# Patient Record
Sex: Female | Born: 1985 | Race: Asian | Hispanic: No | Marital: Married | Smoking: Never smoker
Health system: Southern US, Community
[De-identification: ages and names within clinical notes are randomized; demographics above are authoritative.]

## PROBLEM LIST (undated history)

## (undated) DIAGNOSIS — Z789 Other specified health status: Secondary | ICD-10-CM

---

## 2018-02-12 DIAGNOSIS — Z3201 Encounter for pregnancy test, result positive: Secondary | ICD-10-CM | POA: Diagnosis not present

## 2018-02-12 DIAGNOSIS — O208 Other hemorrhage in early pregnancy: Secondary | ICD-10-CM | POA: Diagnosis not present

## 2018-02-12 DIAGNOSIS — Z3A01 Less than 8 weeks gestation of pregnancy: Secondary | ICD-10-CM | POA: Diagnosis not present

## 2018-02-14 DIAGNOSIS — Z3A01 Less than 8 weeks gestation of pregnancy: Secondary | ICD-10-CM | POA: Diagnosis not present

## 2018-02-14 DIAGNOSIS — O208 Other hemorrhage in early pregnancy: Secondary | ICD-10-CM | POA: Diagnosis not present

## 2018-03-12 NOTE — L&D Delivery Note (Signed)
Delivery Note At 3:25 PM a viable and healthy female was delivered via Vaginal, Spontaneous (Presentation: OA ).  APGAR: 7, 8; weight 2 lb 6.5 oz (1090 g).   Placenta status: spontaneous, complete.  Cord:  with the following complications: loose body cord  .  Cord pH: sent  Anesthesia:  Epidural  Episiotomy: None Lacerations: 2nd degree perineal and right labial Suture Repair: 3.0 vicryl rapide Est. Blood Loss (mL): 100  Mom to postpartum.  Baby to NICU.  [name] 07/15/2018, 4:13 PM

## 2018-03-19 DIAGNOSIS — Z3689 Encounter for other specified antenatal screening: Secondary | ICD-10-CM | POA: Diagnosis not present

## 2018-03-19 DIAGNOSIS — Z1151 Encounter for screening for human papillomavirus (HPV): Secondary | ICD-10-CM | POA: Diagnosis not present

## 2018-03-19 DIAGNOSIS — Z3401 Encounter for supervision of normal first pregnancy, first trimester: Secondary | ICD-10-CM | POA: Diagnosis not present

## 2018-03-19 DIAGNOSIS — Z124 Encounter for screening for malignant neoplasm of cervix: Secondary | ICD-10-CM | POA: Diagnosis not present

## 2018-03-19 DIAGNOSIS — Z118 Encounter for screening for other infectious and parasitic diseases: Secondary | ICD-10-CM | POA: Diagnosis not present

## 2018-03-19 DIAGNOSIS — Z3682 Encounter for antenatal screening for nuchal translucency: Secondary | ICD-10-CM | POA: Diagnosis not present

## 2018-04-01 DIAGNOSIS — Z3401 Encounter for supervision of normal first pregnancy, first trimester: Secondary | ICD-10-CM | POA: Diagnosis not present

## 2018-04-01 DIAGNOSIS — Z3682 Encounter for antenatal screening for nuchal translucency: Secondary | ICD-10-CM | POA: Diagnosis not present

## 2018-04-28 DIAGNOSIS — Z361 Encounter for antenatal screening for raised alphafetoprotein level: Secondary | ICD-10-CM | POA: Diagnosis not present

## 2018-04-28 DIAGNOSIS — Z3402 Encounter for supervision of normal first pregnancy, second trimester: Secondary | ICD-10-CM | POA: Diagnosis not present

## 2018-05-19 DIAGNOSIS — Z3A19 19 weeks gestation of pregnancy: Secondary | ICD-10-CM | POA: Diagnosis not present

## 2018-05-19 DIAGNOSIS — O358XX0 Maternal care for other (suspected) fetal abnormality and damage, not applicable or unspecified: Secondary | ICD-10-CM | POA: Diagnosis not present

## 2018-06-16 DIAGNOSIS — O358XX0 Maternal care for other (suspected) fetal abnormality and damage, not applicable or unspecified: Secondary | ICD-10-CM | POA: Diagnosis not present

## 2018-06-16 DIAGNOSIS — Z3A23 23 weeks gestation of pregnancy: Secondary | ICD-10-CM | POA: Diagnosis not present

## 2018-07-07 DIAGNOSIS — O43122 Velamentous insertion of umbilical cord, second trimester: Secondary | ICD-10-CM | POA: Diagnosis not present

## 2018-07-07 DIAGNOSIS — Z3A26 26 weeks gestation of pregnancy: Secondary | ICD-10-CM | POA: Diagnosis not present

## 2018-07-14 DIAGNOSIS — O26899 Other specified pregnancy related conditions, unspecified trimester: Secondary | ICD-10-CM | POA: Diagnosis not present

## 2018-07-14 DIAGNOSIS — O358XX0 Maternal care for other (suspected) fetal abnormality and damage, not applicable or unspecified: Secondary | ICD-10-CM | POA: Diagnosis not present

## 2018-07-14 DIAGNOSIS — Z3A27 27 weeks gestation of pregnancy: Secondary | ICD-10-CM | POA: Diagnosis not present

## 2018-07-14 DIAGNOSIS — O26892 Other specified pregnancy related conditions, second trimester: Secondary | ICD-10-CM | POA: Diagnosis not present

## 2018-07-14 DIAGNOSIS — O43122 Velamentous insertion of umbilical cord, second trimester: Secondary | ICD-10-CM | POA: Diagnosis not present

## 2018-07-15 ENCOUNTER — Inpatient Hospital Stay (HOSPITAL_COMMUNITY)
Admission: AD | Admit: 2018-07-15 | Discharge: 2018-07-17 | DRG: 805 | Disposition: A | Discharge status: Home / Self Care | Payer: BLUE CROSS/BLUE SHIELD | Attending: Obstetrics & Gynecology | Admitting: Obstetrics & Gynecology

## 2018-07-15 ENCOUNTER — Other Ambulatory Visit: Payer: Self-pay

## 2018-07-15 ENCOUNTER — Inpatient Hospital Stay (HOSPITAL_COMMUNITY): Payer: BLUE CROSS/BLUE SHIELD | Admitting: Anesthesiology

## 2018-07-15 ENCOUNTER — Encounter (HOSPITAL_COMMUNITY): Payer: Self-pay

## 2018-07-15 ENCOUNTER — Inpatient Hospital Stay (HOSPITAL_COMMUNITY)
Admission: AD | Admit: 2018-07-15 | Discharge: 2018-07-15 | Disposition: A | Discharge status: Still In Hospital | Payer: BLUE CROSS/BLUE SHIELD | Attending: Obstetrics & Gynecology | Admitting: Obstetrics & Gynecology

## 2018-07-15 DIAGNOSIS — Z3A27 27 weeks gestation of pregnancy: Secondary | ICD-10-CM | POA: Diagnosis not present

## 2018-07-15 DIAGNOSIS — D509 Iron deficiency anemia, unspecified: Secondary | ICD-10-CM | POA: Diagnosis not present

## 2018-07-15 DIAGNOSIS — O43122 Velamentous insertion of umbilical cord, second trimester: Secondary | ICD-10-CM | POA: Diagnosis present

## 2018-07-15 DIAGNOSIS — O9902 Anemia complicating childbirth: Secondary | ICD-10-CM | POA: Diagnosis not present

## 2018-07-15 DIAGNOSIS — O4703 False labor before 37 completed weeks of gestation, third trimester: Secondary | ICD-10-CM | POA: Diagnosis present

## 2018-07-15 HISTORY — DX: Other specified health status: Z78.9

## 2018-07-15 LAB — ABO/RH: ABO/RH(D): AB POS

## 2018-07-15 LAB — CBC
HCT: 32.4 % — ABNORMAL LOW (ref 36.0–46.0)
HCT: 28.5 % — ABNORMAL LOW (ref 36.0–46.0)
Hemoglobin: 10.8 g/dL — ABNORMAL LOW (ref 12.0–15.0)
Hemoglobin: 9.9 g/dL — ABNORMAL LOW (ref 12.0–15.0)
MCH: 31.1 pg (ref 26.0–34.0)
MCH: 31.6 pg (ref 26.0–34.0)
MCHC: 33.3 g/dL (ref 30.0–36.0)
MCHC: 34.7 g/dL (ref 30.0–36.0)
MCV: 93.4 fL (ref 80.0–100.0)
MCV: 91.1 fL (ref 80.0–100.0)
Platelets: 296 10*3/uL (ref 150–400)
Platelets: 293 10*3/uL (ref 150–400)
RBC: 3.47 MIL/uL — ABNORMAL LOW (ref 3.87–5.11)
RBC: 3.13 MIL/uL — ABNORMAL LOW (ref 3.87–5.11)
RDW: 13.2 % (ref 11.5–15.5)
RDW: 13.1 % (ref 11.5–15.5)
WBC: 21.1 10*3/uL — ABNORMAL HIGH (ref 4.0–10.5)
WBC: 20.1 10*3/uL — ABNORMAL HIGH (ref 4.0–10.5)
nRBC: 0 % (ref 0.0–0.2)
nRBC: 0 % (ref 0.0–0.2)

## 2018-07-15 LAB — TYPE AND SCREEN
ABO/RH(D): AB POS
Antibody Screen: NEGATIVE

## 2018-07-15 MED ORDER — INDOMETHACIN 25 MG PO CAPS
50.0000 mg | ORAL_CAPSULE | Freq: Once | ORAL | Status: AC
  Administered 2018-07-15: 50 mg via ORAL
  Filled 2018-07-15: qty 2

## 2018-07-15 MED ORDER — SIMETHICONE 80 MG PO CHEW: 80.0000 mg | CHEWABLE_TABLET | ORAL | Status: DC | PRN

## 2018-07-15 MED ORDER — LACTATED RINGERS IV SOLN: 500.0000 mL | INTRAVENOUS | Status: DC | PRN

## 2018-07-15 MED ORDER — ONDANSETRON HCL 4 MG/2ML IJ SOLN: 4.0000 mg | INTRAMUSCULAR | Status: DC | PRN

## 2018-07-15 MED ORDER — DIBUCAINE (PERIANAL) 1 % EX OINT: 1.0000 "application " | TOPICAL_OINTMENT | CUTANEOUS | Status: DC | PRN

## 2018-07-15 MED ORDER — ONDANSETRON HCL 4 MG/2ML IJ SOLN: 4.0000 mg | Freq: Four times a day (QID) | INTRAMUSCULAR | Status: DC | PRN

## 2018-07-15 MED ORDER — MAGNESIUM SULFATE 40 G IN LACTATED RINGERS - SIMPLE: 2.0000 g/h | INTRAVENOUS | Status: DC

## 2018-07-15 MED ORDER — OXYCODONE-ACETAMINOPHEN 5-325 MG PO TABS: 2.0000 | ORAL_TABLET | ORAL | Status: DC | PRN

## 2018-07-15 MED ORDER — OXYTOCIN 40 UNITS IN NORMAL SALINE INFUSION - SIMPLE MED
1.0000 m[IU]/min | INTRAVENOUS | Status: DC
  Administered 2018-07-15: 15:00:00 2 m[IU]/min via INTRAVENOUS

## 2018-07-15 MED ORDER — CALCIUM CARBONATE ANTACID 500 MG PO CHEW: 2.0000 | CHEWABLE_TABLET | ORAL | Status: DC | PRN

## 2018-07-15 MED ORDER — OXYTOCIN BOLUS FROM INFUSION
500.0000 mL | Freq: Once | INTRAVENOUS | Status: AC
  Administered 2018-07-15: 500 mL via INTRAVENOUS

## 2018-07-15 MED ORDER — MAGNESIUM SULFATE BOLUS VIA INFUSION
4.0000 g | Freq: Once | INTRAVENOUS | Status: DC
  Filled 2018-07-15: qty 500

## 2018-07-15 MED ORDER — PHENYLEPHRINE 40 MCG/ML (10ML) SYRINGE FOR IV PUSH (FOR BLOOD PRESSURE SUPPORT)
80.0000 ug | PREFILLED_SYRINGE | INTRAVENOUS | Status: DC | PRN
  Filled 2018-07-15 (×2): qty 10

## 2018-07-15 MED ORDER — ACETAMINOPHEN 325 MG PO TABS
650.0000 mg | ORAL_TABLET | ORAL | Status: DC | PRN
  Administered 2018-07-15: 11:00:00 650 mg via ORAL
  Filled 2018-07-15: qty 2

## 2018-07-15 MED ORDER — EPHEDRINE 5 MG/ML INJ
10.0000 mg | INTRAVENOUS | Status: DC | PRN
  Filled 2018-07-15: qty 2

## 2018-07-15 MED ORDER — IBUPROFEN 600 MG PO TABS
600.0000 mg | ORAL_TABLET | Freq: Four times a day (QID) | ORAL | Status: DC
  Administered 2018-07-15 – 2018-07-17 (×7): 600 mg via ORAL
  Filled 2018-07-15 (×7): qty 1

## 2018-07-15 MED ORDER — SOD CITRATE-CITRIC ACID 500-334 MG/5ML PO SOLN: 30.0000 mL | ORAL | Status: DC | PRN

## 2018-07-15 MED ORDER — LIDOCAINE HCL (PF) 1 % IJ SOLN
30.0000 mL | INTRAMUSCULAR | Status: DC | PRN
  Filled 2018-07-15: qty 30

## 2018-07-15 MED ORDER — DIPHENHYDRAMINE HCL 25 MG PO CAPS: 25.0000 mg | ORAL_CAPSULE | Freq: Four times a day (QID) | ORAL | Status: DC | PRN

## 2018-07-15 MED ORDER — BETAMETHASONE SOD PHOS & ACET 6 (3-3) MG/ML IJ SUSP
12.0000 mg | INTRAMUSCULAR | Status: DC
  Administered 2018-07-15: 12 mg via INTRAMUSCULAR
  Filled 2018-07-15 (×4): qty 2

## 2018-07-15 MED ORDER — TERBUTALINE SULFATE 1 MG/ML IJ SOLN: 0.2500 mg | Freq: Once | INTRAMUSCULAR | Status: DC | PRN

## 2018-07-15 MED ORDER — DOCUSATE SODIUM 100 MG PO CAPS
100.0000 mg | ORAL_CAPSULE | Freq: Every day | ORAL | Status: DC
  Administered 2018-07-15: 11:00:00 100 mg via ORAL
  Filled 2018-07-15: qty 1

## 2018-07-15 MED ORDER — LACTATED RINGERS IV SOLN: INTRAVENOUS | Status: DC

## 2018-07-15 MED ORDER — PHENYLEPHRINE 40 MCG/ML (10ML) SYRINGE FOR IV PUSH (FOR BLOOD PRESSURE SUPPORT)
80.0000 ug | PREFILLED_SYRINGE | INTRAVENOUS | Status: DC | PRN
  Filled 2018-07-15: qty 10

## 2018-07-15 MED ORDER — DIPHENHYDRAMINE HCL 50 MG/ML IJ SOLN: 12.5000 mg | INTRAMUSCULAR | Status: DC | PRN

## 2018-07-15 MED ORDER — PRENATAL MULTIVITAMIN CH
1.0000 | ORAL_TABLET | Freq: Every day | ORAL | Status: DC
  Administered 2018-07-15: 1 via ORAL
  Filled 2018-07-15: qty 1

## 2018-07-15 MED ORDER — SENNOSIDES-DOCUSATE SODIUM 8.6-50 MG PO TABS
2.0000 | ORAL_TABLET | ORAL | Status: DC
  Administered 2018-07-15 – 2018-07-16 (×2): 2 via ORAL
  Filled 2018-07-15 (×2): qty 2

## 2018-07-15 MED ORDER — COCONUT OIL OIL
1.0000 "application " | TOPICAL_OIL | Status: DC | PRN
  Administered 2018-07-15: 1 via TOPICAL

## 2018-07-15 MED ORDER — FENTANYL-BUPIVACAINE-NACL 0.5-0.125-0.9 MG/250ML-% EP SOLN
12.0000 mL/h | EPIDURAL | Status: DC | PRN
  Filled 2018-07-15: qty 250

## 2018-07-15 MED ORDER — OXYCODONE-ACETAMINOPHEN 5-325 MG PO TABS: 1.0000 | ORAL_TABLET | ORAL | Status: DC | PRN

## 2018-07-15 MED ORDER — WITCH HAZEL-GLYCERIN EX PADS: 1.0000 "application " | MEDICATED_PAD | CUTANEOUS | Status: DC | PRN

## 2018-07-15 MED ORDER — SODIUM CHLORIDE (PF) 0.9 % IJ SOLN
INTRAMUSCULAR | Status: DC | PRN
  Administered 2018-07-15: 12 mL/h via EPIDURAL

## 2018-07-15 MED ORDER — LACTATED RINGERS IV BOLUS
500.0000 mL | Freq: Once | INTRAVENOUS | Status: AC
  Administered 2018-07-15: 500 mL via INTRAVENOUS

## 2018-07-15 MED ORDER — ACETAMINOPHEN 325 MG PO TABS: 650.0000 mg | ORAL_TABLET | ORAL | Status: DC | PRN

## 2018-07-15 MED ORDER — SODIUM CHLORIDE 0.9 % IV SOLN
2.0000 g | Freq: Four times a day (QID) | INTRAVENOUS | Status: DC
  Administered 2018-07-15: 12:00:00 2 g via INTRAVENOUS
  Filled 2018-07-15: qty 2000

## 2018-07-15 MED ORDER — ONDANSETRON HCL 4 MG PO TABS: 4.0000 mg | ORAL_TABLET | ORAL | Status: DC | PRN

## 2018-07-15 MED ORDER — MAGNESIUM SULFATE BOLUS VIA INFUSION
6.0000 g | Freq: Once | INTRAVENOUS | Status: AC
  Administered 2018-07-15: 12:00:00 6 g via INTRAVENOUS

## 2018-07-15 MED ORDER — OXYTOCIN 40 UNITS IN NORMAL SALINE INFUSION - SIMPLE MED
2.5000 [IU]/h | INTRAVENOUS | Status: DC
  Filled 2018-07-15: qty 1000

## 2018-07-15 MED ORDER — BENZOCAINE-MENTHOL 20-0.5 % EX AERO
1.0000 "application " | INHALATION_SPRAY | CUTANEOUS | Status: DC | PRN
  Administered 2018-07-15: 1 via TOPICAL
  Filled 2018-07-15: qty 56

## 2018-07-15 MED ORDER — TETANUS-DIPHTH-ACELL PERTUSSIS 5-2.5-18.5 LF-MCG/0.5 IM SUSP
0.5000 mL | Freq: Once | INTRAMUSCULAR | Status: AC
  Administered 2018-07-16: 10:00:00 0.5 mL via INTRAMUSCULAR
  Filled 2018-07-15: qty 0.5

## 2018-07-15 MED ORDER — ZOLPIDEM TARTRATE 5 MG PO TABS: 5.0000 mg | ORAL_TABLET | Freq: Every evening | ORAL | Status: DC | PRN

## 2018-07-15 MED ORDER — LACTATED RINGERS IV SOLN
500.0000 mL | Freq: Once | INTRAVENOUS | Status: AC
  Administered 2018-07-15: 13:00:00 500 mL via INTRAVENOUS

## 2018-07-15 MED ORDER — PRENATAL MULTIVITAMIN CH
1.0000 | ORAL_TABLET | Freq: Every day | ORAL | Status: DC
  Administered 2018-07-16: 12:00:00 1 via ORAL
  Filled 2018-07-15: qty 1

## 2018-07-15 MED ORDER — MAGNESIUM SULFATE 40 G IN LACTATED RINGERS - SIMPLE
INTRAVENOUS | Status: AC
  Filled 2018-07-15: qty 500

## 2018-07-15 MED ORDER — INDOMETHACIN 25 MG PO CAPS
25.0000 mg | ORAL_CAPSULE | Freq: Four times a day (QID) | ORAL | Status: DC
  Filled 2018-07-15: qty 1

## 2018-07-15 MED ORDER — LIDOCAINE HCL (PF) 1 % IJ SOLN
INTRAMUSCULAR | Status: DC | PRN
  Administered 2018-07-15: 6 mL via EPIDURAL

## 2018-07-15 NOTE — Anesthesia Preprocedure Evaluation (Signed)
Anesthesia Evaluation  Patient identified by MRN, date of birth, ID band Patient awake    Reviewed: Allergy & Precautions, NPO status , Patient's Chart, lab work & pertinent test results  Airway Mallampati: II  TM Distance: >3 FB Neck ROM: Full    Dental no notable dental hx. (+) Teeth Intact   Pulmonary neg pulmonary ROS,    Pulmonary exam normal breath sounds clear to auscultation       Cardiovascular Exercise Tolerance: Good negative cardio ROS Normal cardiovascular exam Rhythm:Regular Rate:Normal     Neuro/Psych    GI/Hepatic negative GI ROS, Neg liver ROS,   Endo/Other    Renal/GU negative Renal ROS     Musculoskeletal   Abdominal   Peds  Hematology   Anesthesia Other Findings   Reproductive/Obstetrics (+) Pregnancy 27.[redacted] wk GA now 9cm dilated                             Anesthesia Physical Anesthesia Plan  ASA: II  Anesthesia Plan: Epidural   Post-op Pain Management:    Induction:   PONV Risk Score and Plan:   Airway Management Planned: Simple Face Mask and Natural Airway  Additional Equipment:   Intra-op Plan:   Post-operative Plan:   Informed Consent: I have reviewed the patients History and Physical, chart, labs and discussed the procedure including the risks, benefits and alternatives for the proposed anesthesia with the patient or authorized representative who has indicated his/her understanding and acceptance.       Plan Discussed with:   Anesthesia Plan Comments:         Anesthesia Quick Evaluation

## 2018-07-15 NOTE — H&P (Addendum)
Amy Robbins is a 33 y.o. female G1, 27.4 wks by LMP =1st trim sono [facility] 10/10/18. Presenting with uterine contractions since 5/3 night, Procardia 10mg  PO q 8hrs given at office visit yesterday since very preterm but cervix was closed, CL 2.6 cm and no funeling noted and FFN came back negative.  She took 3 doses of Procardia 10 mg 8 hrs apart helped initially but UCs got stronger at 3 am. She also noted slight vaginal spotting, hence called office this morning. No LOF. Good FMs.   FFN neg in office on 5/4  UA was neg, Ucx sent on 5/4. No h/o renal stones. Office vaginal CL 2.6 cm without funeling on 5/4. Vx, EFW 2'6" 59%, AC nl, AFI nl  Pregnancy complicated by low 1st trim progesterone and spotting/cramping, took Prometrium 200mg  until 12 wks. Velamentous cord, nl anatomy, nl AGA growth on 5/4.  GERD.  Carpel tunnel pain/ numbness.   OB History   No obstetric history on file.    PMedHx- neg. Appendicitis treated with antibiotic in 2018 PSurgHX - neg SocHx- no ETOH/ Smoking/ Illicit drugs FamHx- reviewed, N/A      Maternal Diabetes: to be done at 28 wks  Genetic Screening: Normal Maternal Ultrasounds/Referrals: Normal anatomy, Velamentous cord, growth AGA at 27 wks on 5/4 Fetal Ultrasounds or other Referrals:  None Maternal Substance Abuse:  No Significant Maternal Medications:  Meds include: Progesterone in 1st trim.  BTMZ 2 doses, start today Significant Maternal Lab Results:  Not done Other Comments:  Threatened preterm contractions   ROS neg for fever/ chills/ dysuria/ n/v. (+) for constipation   History   Blood pressure 110/66, pulse 79, resp. rate 18, SpO2 96 %. Exam Physical Exam  Physical exam:  A&O x 3, in distress from UCs. HEENT neg Lungs CTA bilat CV RRR, S1S2 normal Abdo soft, non tender, gravid uterus with palpable UCs  Extr no edema/ tenderness Pelvic Cx 8 cm/ BBW/ 100%/ Vx. Bloody show  FHT  150s + variable decels, + accels, mod variability - cat  I Toco palpable q 3 min   Prenatal labs: ABO, Rh: AB pos Antibody:  neg Rubella:  imm RPR:   NR HBsAg:   Neg HIV:   Neg GBS:   not done   Assessment/Plan: Active preterm labor Indocin 50mg  PO at 11am BTMZ 11 am Magnesium for neuroprophylaxis 6 gm bolus and now at 2 gm/hr Ampicillin for unkn GBS now Epidural if possible.   NICU Dr Amy Robbins aware     Amy Robbins 07/15/2018, 10:43 AM

## 2018-07-15 NOTE — Lactation Note (Signed)
This note was copied from a baby's chart. Lactation Consultation Note  Patient Name: Amy Robbins [id number]'[id number] Date: 07/15/2018 Reason for consult: Initial assessment;Primapara;1st time breastfeeding;Infant < 6lbs;NICU baby;Preterm <34wks  Visited with mom of a 7 hours old pre-term NICU female < 3 lbs. Mom is a P1 and she's been practicing hand expression with her RN and has been able to get a couple of drops of colostrum. Parents feeling discouraged due to baby's G.A of 27 weeks. Provided emotional support to mom and discussed benefits of premature milk, mom voiced she pumped once today but "nothing" came out. Explained to mom that the purpose of pumping early on is mainly for breast stimulation and not to get volume. Discussed lactogenesis II.  Reviewed instructions and use of her DEBP, mom had questions. LC also reviewed pumping log and milk storage guidelines. Dad present and supportive, he also had questions for [facility] regarding lactogenesis II and how this could be delayed due to the fact that baby was born premature and this is their first baby. Asked parents to call for assistance if more questions or concerns arise.  Feeding plan:  1. Encouraged mom to pump every 3 hours and at least once at night 2. Hand expression and breast massage was also encouraged prior pumping sessions 3. Once mom starts getting drops/volume she'll put those in the fridge until taken upstairs to her NICU baby  BF brochure, BF resources and NICU booklet were reviewed. Parents reported all questions and concerns were answered, they're both aware of LC services and will call PRN.  Maternal Data Formula Feeding for Exclusion: No Has patient been taught Hand Expression?: Yes Does the patient have breastfeeding experience prior to this delivery?: No  Feeding    Interventions Interventions: Breast feeding basics reviewed;DEBP  Lactation Tools Discussed/Used Tools: Pump Breast pump type: Double-Electric Breast  Pump WIC Program: No Pump Review: Setup, frequency, and cleaning;Milk Storage Initiated by:: RN Date initiated:: 07/15/18   Consult Status Consult Status: PRN    Ki Luckman S Reinhard Schack 07/15/2018, 10:28 PM

## 2018-07-15 NOTE — Anesthesia Procedure Notes (Signed)
Epidural Patient location during procedure: OB Start time: 07/15/2018 1:00 PM End time: 07/15/2018 1:17 PM  Staffing Anesthesiologist: Trevor Iha, MD Performed: anesthesiologist   Preanesthetic Checklist Completed: patient identified, site marked, surgical consent, pre-op evaluation, timeout performed, IV checked, risks and benefits discussed and monitors and equipment checked  Epidural Patient position: left lateral decubitus Prep: site prepped and draped and DuraPrep Patient monitoring: continuous pulse ox and blood pressure Approach: midline Location: L3-L4 Injection technique: LOR air  Needle:  Needle type: Tuohy  Needle gauge: 17 G Needle length: 9 cm and 9 Needle insertion depth: 5 cm cm Catheter type: closed end flexible Catheter size: 19 Gauge Catheter at skin depth: 10 cm Test dose: negative  Assessment Events: blood not aspirated, injection not painful, no injection resistance, negative IV test and no paresthesia  Additional Notes Patient identified. Risks/Benefits/Options discussed with patient including but not limited to bleeding, infection, nerve damage, paralysis, failed block, incomplete pain control, headache, blood pressure changes, nausea, vomiting, reactions to medication both or allergic, itching and postpartum back pain. Confirmed with bedside nurse the patient's most recent platelet count. Confirmed with patient that they are not currently taking any anticoagulation, have any bleeding history or any family history of bleeding disorders. Patient expressed understanding and wished to proceed. All questions were answered. Sterile technique was used throughout the entire procedure. Please see nursing notes for vital signs. Test dose was given through epidural needle and negative prior to continuing to dose epidural or start infusion. Warning signs of high block given to the patient including shortness of breath, tingling/numbness in hands, complete motor block,  or any concerning symptoms with instructions to call for help. Patient was given instructions on fall risk and not to get out of bed. All questions and concerns addressed with instructions to call with any issues. 2 Attempt (S) . Patient tolerated procedure well.

## 2018-07-15 NOTE — Progress Notes (Addendum)
Late note Pt seen on Ante, she had received BTMZ and Indocin before I arrived.  6 gm Magnesium sulphate bolus for neuroprophylaxis given and she was moved to L&D. Ampicillin 2 gm was hung when we arrived to L&D for unknown GBS. In L&D, she advanced to 9 cm, BBOW, requesting pain medication. Proceeded with epidural since delivery was imminent.  FHT 140s with intermittent variable decels down to 70-80s with contractions but moderate variability noted, overall cat I She progressed to 10 cm, BBOW, epidural working well, decision was made to deliver. Android pelvis, tight perineal body noted.  She pushed 1 hr with some progress but UCs spaced out as magnesium was running, that was stopped and pitocin started at 2 mu rate and she delivered in next 20 minutes SVD. See delivery note for details.  NICU team in attendance received baby, delayed cord clamping at 1 min since baby was vigorous.

## 2018-07-16 DIAGNOSIS — O9902 Anemia complicating childbirth: Secondary | ICD-10-CM | POA: Diagnosis present

## 2018-07-16 LAB — CBC
HCT: 27.7 % — ABNORMAL LOW (ref 36.0–46.0)
Hemoglobin: 9.4 g/dL — ABNORMAL LOW (ref 12.0–15.0)
MCH: 31.4 pg (ref 26.0–34.0)
MCHC: 33.9 g/dL (ref 30.0–36.0)
MCV: 92.6 fL (ref 80.0–100.0)
Platelets: 280 10*3/uL (ref 150–400)
RBC: 2.99 MIL/uL — ABNORMAL LOW (ref 3.87–5.11)
RDW: 13.2 % (ref 11.5–15.5)
WBC: 20.3 10*3/uL — ABNORMAL HIGH (ref 4.0–10.5)
nRBC: 0 % (ref 0.0–0.2)

## 2018-07-16 LAB — RPR: RPR Ser Ql: NONREACTIVE

## 2018-07-16 MED ORDER — MAGNESIUM OXIDE 400 (241.3 MG) MG PO TABS
400.0000 mg | ORAL_TABLET | Freq: Every day | ORAL | Status: DC
  Administered 2018-07-16 – 2018-07-17 (×2): 400 mg via ORAL
  Filled 2018-07-16 (×2): qty 1

## 2018-07-16 MED ORDER — POLYSACCHARIDE IRON COMPLEX 150 MG PO CAPS
150.0000 mg | ORAL_CAPSULE | Freq: Every day | ORAL | Status: DC
  Administered 2018-07-16 – 2018-07-17 (×2): 150 mg via ORAL
  Filled 2018-07-16 (×2): qty 1

## 2018-07-16 NOTE — Progress Notes (Signed)
Patient ID: Amy Robbins, female   DOB: 01-22-1986, 33 y.o.   MRN: [id number] PPD # 1 S/P 27 wks preterm NSVD  Live born female  Birth Weight: 2 lb 6.5 oz (1090 g) APGAR: 7, 8  Newborn Delivery   Birth date/time:  07/15/2018 15:25:00 Delivery type:  Vaginal, Spontaneous NICU admit - prematurity - doing well   Baby name: undecided Delivering provider: MODY, VAISHALI  Episiotomy:None   Lacerations:2nd degree;Labial   circumcision undecided  Feeding: breast / pumping  Pain control at delivery: Epidural   S:  Reports feeling well             Tolerating po/ No nausea or vomiting             Bleeding is light             Pain controlled with ibuprofen (OTC)             Up ad lib / ambulatory / voiding without difficulties   O:  A & O x 3, in no apparent distress              VS:  Vitals:   07/15/18 1931 07/15/18 2356 07/16/18 0347 07/16/18 0800  BP: 114/60 (!) 119/57 (!) 115/56 108/71  Pulse: 87 72 79 80  Resp: 16 18 18 18   Temp: 98.6 F (37 C) 98.1 F (36.7 C) 98.1 F (36.7 C) 97.7 F (36.5 C)  TempSrc: Oral Oral Oral Oral  SpO2: 100% 100% 99% 99%  Weight:      Height:        LABS:  Recent Labs    07/15/18 1715 07/16/18 0651  WBC 20.1* 20.3*  HGB 9.9* 9.4*  HCT 28.5* 27.7*  PLT 293 280    Blood type: --/--/AB POS, AB POS Performed at [facility], [address] [place], [place] [address]  (05/05 1106)  Rubella:     I&O: I/O last 3 completed shifts: In: 0  Out: 608 [Urine:500; Blood:108]          No intake/output data recorded.  Vaccines: TDaP given today         Flu    declined   Lungs: Clear and unlabored  Heart: regular rate and rhythm / no murmurs  Abdomen: soft, non-tender, non-distended             Fundus: firm, non-tender, U-2  Perineum: repair intact  Lochia: small  Extremities: no edema, no calf pain or tenderness    A/P: PPD # 1 32 y.o., G1P0101   Principal Problem:   Postpartum care following vaginal delivery (5/5) Active  Problems:   Preterm delivery   SVD (spontaneous vaginal delivery)   2nd degree perineal and right labial lacerations   Doing well - stable status  Routine post partum orders  Anticipate discharge tomorrow    Neta Mends, MSN, CNM 07/16/2018, 8:52 AM

## 2018-07-16 NOTE — Anesthesia Postprocedure Evaluation (Signed)
Anesthesia Post Note  Patient: Amy Robbins  Procedure(s) Performed: AN AD HOC LABOR EPIDURAL     Patient location during evaluation: Mother Baby Anesthesia Type: Epidural Level of consciousness: awake Pain management: satisfactory to patient Vital Signs Assessment: post-procedure vital signs reviewed and stable Respiratory status: spontaneous breathing Cardiovascular status: stable Anesthetic complications: no    Last Vitals:  Vitals:   07/16/18 0347 07/16/18 0800  BP: (!) 115/56 108/71  Pulse: 79 80  Resp: 18 18  Temp: 36.7 C 36.5 C  SpO2: 99% 99%    Last Pain:  Vitals:   07/16/18 0800  TempSrc: Oral  PainSc:    Pain Goal: Patients Stated Pain Goal: 3 (07/15/18 1040)                 Cephus Shelling

## 2018-07-17 MED ORDER — IBUPROFEN 600 MG PO TABS: 600.0000 mg | ORAL_TABLET | Freq: Four times a day (QID) | ORAL | 0 refills | Status: AC

## 2018-07-17 MED ORDER — PRENATAL MULTIVITAMIN CH: 1.0000 | ORAL_TABLET | Freq: Every day | ORAL | Status: AC

## 2018-07-17 MED ORDER — MAGNESIUM OXIDE 400 (241.3 MG) MG PO TABS: 400.0000 mg | ORAL_TABLET | Freq: Every day | ORAL | 0 refills | Status: AC

## 2018-07-17 MED ORDER — POLYSACCHARIDE IRON COMPLEX 150 MG PO CAPS: 150.0000 mg | ORAL_CAPSULE | Freq: Every day | ORAL | 0 refills | Status: AC

## 2018-07-17 NOTE — Discharge Summary (Signed)
Obstetric Discharge Summary Reason for Admission: onset of labor - preterm at 27 weeks  Prenatal Procedures: NST, ultrasound and tocolysis with Procardia Intrapartum Procedures: magnesium neuro-prophylaxis, spontaneous vaginal delivery, GBS prophylaxis and epidural Postpartum Procedures: none Complications-Operative and Postpartum: 2nd degree perineal laceration / newborn admit to NICU PTB Hemoglobin  Date Value Ref Range Status  07/16/2018 9.4 (L) 12.0 - 15.0 g/dL Final   HCT  Date Value Ref Range Status  07/16/2018 27.7 (L) 36.0 - 46.0 % Final   Physical Exam:  General: alert, cooperative and no distress Lochia: appropriate Uterine Fundus: firm Incision: healing well DVT Evaluation: No evidence of DVT seen on physical exam.  Discharge Diagnoses: Premature labor & delivery at 27 weeks / IDA of pregnancy / PPD 2 s/p SVD with 2nd degree perineal repair  Discharge Information: Date: 07/17/2018 Activity: pelvic rest Diet: routine Medications: PNV, Ibuprofen, Iron and mag ox Condition: stable Instructions: refer to practice specific booklet Discharge to: home Follow-up Information    Shea Evans, MD. Schedule an appointment as soon as possible for a visit in 6 week(s).   Specialty:  Obstetrics and Gynecology Contact information: [address] [place] [address] [phone]           Newborn Data: Live born female  Birth Weight: 2 lb 6.5 oz (1090 g) APGAR: 7, 8  Newborn Delivery   Birth date/time:  07/15/2018 15:25:00 Delivery type:  Vaginal, Spontaneous    Admission into NICU - preterm birth at 22 weeks  Amy Robbins 07/17/2018, 10:12 AM

## 2018-07-17 NOTE — Progress Notes (Signed)
PPD 2 SVD with 2nd degree repair  S:  Reports feeling ok - some mild backache              Tolerating po/ No nausea or vomiting             Bleeding is light             Pain controlled with Motrin             Up ad lib / ambulatory / voiding QS  Newborn in NICU - stable / mom started pumping last night for BF  O:      VS: BP (!) 105/57 (BP Location: Left Arm)   Pulse 74   Temp (!) 97.5 F (36.4 C) (Oral)   Resp 16   Ht 5\' 6"  (1.676 m)   Wt 83.9 kg   LMP 01/03/2018 (Exact Date)   SpO2 98%   Breastfeeding Unknown   BMI 29.86 kg/m    LABS:             Recent Labs    07/15/18 1715 07/16/18 0651  WBC 20.1* 20.3*  HGB 9.9* 9.4*  PLT 293 280               Blood type: --/--/AB POS, AB POS Performed at [facility], [address], [place], [place] [address]  (05/05 1106)               Physical Exam:             Alert and oriented X3  Abdomen: soft, non-tender, non-distended              Fundus: firm, non-tender, U-2  Perineum: no edema  Lochia: light to scant  Extremities: no edema, no calf pain or tenderness    A: PPD # 2 SVD with 2nd degree repair             Preterm delivery at 27 weeks             IDA of pregnancy   Doing well - stable status  P: Routine post partum orders  DC home this afternoon - told that DR Juliene Pina will be @ at noon to see before DC  Marlinda Mike CNM, MSN, [facility] 07/17/2018, 8:42 AM

## 2018-07-17 NOTE — Progress Notes (Signed)
Discharge instructions given to patient. Discussed postpartum instructions, medication changes and follow up appointment. [facility] booklet given, pre-e pamphlet given. Dr Juliene Pina to see patient at noon, then patient to discharge.

## 2018-07-17 NOTE — Clinical Social Work Maternal (Signed)
CLINICAL SOCIAL WORK MATERNAL/CHILD NOTE  Patient Details  Name: Amy Robbins MRN: [id number] Date of Birth: 1985-05-23  Date:  07/17/2018  Clinical Social Worker Initiating Note:  [place], [place] Date/Time: Initiated:  07/17/18/1051     Child's Name:  Amy Robbins   Biological Parents:  Mother, Father(Father: Amy Robbins)   Need for Interpreter:  None   Reason for Referral:  Parental Support of Premature Babies < 32 weeks/or Critically Ill babies   Address:  [address] [place] [address]    Phone number:  [phone] (home)     Additional phone number:   Household Members/Support Persons (HM/SP):   Household Member/Support Person 1   HM/SP Name Relationship DOB or Age  HM/SP -1 Amy Robbins FOB/Husband    HM/SP -2        HM/SP -3        HM/SP -4        HM/SP -5        HM/SP -6        HM/SP -7        HM/SP -8          Natural Supports (not living in the home):  Immediate Family(sister in Social worker)   Professional Supports: None   Employment: Full-time   Type of Work: Civil engineer, contracting    Education:  Engineer, maintenance (IT)   Homebound arranged:    Surveyor, quantity Resources:  Media planner   Other Resources:      Cultural/Religious Considerations Which May Impact Care:    Strengths:  Ability to meet basic needs , Home prepared for child , Understanding of illness   Psychotropic Medications:         Pediatrician:       Pediatrician List:   Radiographer, therapeutic    [place]      Pediatrician Fax Number:    Risk Factors/Current Problems:  None   Cognitive State:  Able to Concentrate , Alert , Linear Thinking , Goal Oriented    Mood/Affect:  Calm , Interested , Relaxed    CSW Assessment: CSW spoke with MOB at bedside regarding infant's NICU admission, MOB asked that FOB be a part of the assessment via telephone on speaker. CSW agreed that FOB can  participate via telephone. CSW introduced self and explained reason for consult. MOB was welcoming and engaged during assessment. MOB reported that she resides with husband and works full time as a Civil engineer, contracting. MOB reported that she has received a lot of items for infant and believes that she will be able to obtain all items prior to infant's discharge. CSW inquired about MOB's support system, MOB reported that her sister in law stays two miles away from her and is a support.   CSW and MOB/FOB discussed infant's NICU admission. MOB reported that the NICU admission has been great and that staff has been supportive. MOB reported that she feels well informed about infant's care. CSW informed MOB about the NICU, what to expect and resources/supports available while infant is admitted to the NICU. MOB denied any needs/concerns. CSW provided MOB with a butterfly from [employer], MOB appreciative. MOB denied any transportation barriers to come and visit infant.   CSW asked to speak with MOB alone without FOB on the phone, MOB agreed and got off the phone with FOB. CSW inquired about MOB's mental health history, MOB denied  any mental health history. MOB presented calm and did not demonstrate any acute mental health signs/symptoms. CSW inquired about MOB's current emotional state, MOB reported that she was doing good. CSW assessed for safety, MOB denied SI, HI and domestic violence.  CSW provided education regarding the baby blues period vs. perinatal mood disorders, discussed treatment and gave resources for mental health follow up if concerns arise.  CSW recommends self-evaluation during the postpartum time period using the New Mom Checklist from Postpartum Progress and encouraged MOB to contact a medical professional if symptoms are noted at any time.    CSW will continue to follow and provide supports/resources while infant is admitted to the NICU.   CSW Plan/Description:  Perinatal  Mood and Anxiety Disorder (PMADs) Education, Other Patient/Family Education    Amy PolesKimberly L Darnice Comrie, LCSW 07/17/2018, 1:51 PM

## 2018-11-20 ENCOUNTER — Ambulatory Visit: Payer: Self-pay

## 2018-11-20 NOTE — Lactation Note (Signed)
This note was copied from a baby's chart. Lactation Consultation Note  Patient Name: Amy Robbins [id number] Date: 11/20/2018   Attempted to visit with mom, but she wasn't in the room. Baby is now 4 months but apparently has never been seen by lactation before. According to NICU RN he was born here but then was transferred to [facility]. NICU RN to call back when mom comes back in case she has questions/concerns about breastfeeding and lactation; baby is getting discharged today, mom is pumping but baby is also on Elecare formula.  Maternal Data    Feeding Feeding Type: Formula Nipple Type: Dr. Clement Husbands  [facility] Score                   Interventions    Lactation Tools Discussed/Used     Consult Status      Amy Robbins Francene Boyers 11/20/2018, 4:05 PM

## 2018-11-20 NOTE — Lactation Note (Signed)
This note was copied from a baby's chart. Lactation Consultation Note  Patient Name: Amy Robbins KZLDJ'T Date: 11/20/2018 Reason for consult: Follow-up assessment;NICU baby  Visited with mom of a 65 months old NICU female born at 30 4/7 weeks, parents are finally taking baby home today. Baby was a patient on this hospital the first two weeks after birth, but then he got transferred to [facility]. Baby was readmitted on 09/04 but mom has not seen lactation since her initial visit, she's been pumping though but not consistently about 4 times/24 hours, she's getting about 6 oz of EBM combined per pumping session on her first pumping in the AM, but then it goes down to 3-4 oz. Baby is also on Elecare.  Feeding plan:  1. Advised mom to start doing power pumping in the first session on the AM to try to increase her milk supply and to pump at least  2. Mom will try increasing her pumping sessions to at least 6-8 in 24 hours 3. Mom asked LC about galactagogues, she'll be trying brewer's yeast 4. Mom will also to follow up with [facility] OP whenever baby is ready to take the breast, he's just doing bottles for now.    Lactation brochure and NICU booklet reviewed, parents reported all questions and concerns were answered, they're both aware of [facility] OP services and will call PRN.  Maternal Data    Feeding Feeding Type: Formula Nipple Type: Dr. Clement Husbands  LATCH Score                   Interventions Interventions: Breast feeding basics reviewed  Lactation Tools Discussed/Used     Consult Status Consult Status: Complete Date: 11/20/18 Follow-up type: Call as needed(will call to schedule an OP LC appt when baby is ready)    Lenise Herald 11/20/2018, 5:18 PM

## 2019-04-06 DIAGNOSIS — Z01419 Encounter for gynecological examination (general) (routine) without abnormal findings: Secondary | ICD-10-CM | POA: Diagnosis not present

## 2019-04-06 DIAGNOSIS — Z6830 Body mass index (BMI) 30.0-30.9, adult: Secondary | ICD-10-CM | POA: Diagnosis not present

## 2019-05-28 ENCOUNTER — Ambulatory Visit: Payer: Self-pay | Attending: Internal Medicine

## 2019-05-28 DIAGNOSIS — Z23 Encounter for immunization: Secondary | ICD-10-CM

## 2019-05-28 NOTE — Progress Notes (Signed)
   Covid-19 Vaccination Clinic  Name:  Amy Robbins    MRN: [id number] DOB: 07-23-1985  05/28/2019  [name] was observed post Covid-19 immunization for 15 minutes without incident. She was provided with Vaccine Information Sheet and instruction to access the V-Safe system.   [name] was instructed to call 911 with any severe reactions post vaccine: Marland Kitchen Difficulty breathing  . Swelling of face and throat  . A fast heartbeat  . A bad rash all over body  . Dizziness and weakness   Immunizations Administered    Name Date Dose VIS Date Route   Pfizer COVID-19 Vaccine 05/28/2019  1:58 PM 0.3 mL 02/20/2019 Intramuscular   Manufacturer: [employer], [employer]   Lot: [id number]   NDC: [address]-5

## 2019-05-29 DIAGNOSIS — Z0000 Encounter for general adult medical examination without abnormal findings: Secondary | ICD-10-CM | POA: Diagnosis not present

## 2019-05-29 DIAGNOSIS — Z1329 Encounter for screening for other suspected endocrine disorder: Secondary | ICD-10-CM | POA: Diagnosis not present

## 2019-05-29 DIAGNOSIS — Z13220 Encounter for screening for lipoid disorders: Secondary | ICD-10-CM | POA: Diagnosis not present

## 2019-05-29 DIAGNOSIS — Z131 Encounter for screening for diabetes mellitus: Secondary | ICD-10-CM | POA: Diagnosis not present

## 2019-05-29 DIAGNOSIS — Z130 Encounter for screening for diseases of the blood and blood-forming organs and certain disorders involving the immune mechanism: Secondary | ICD-10-CM | POA: Diagnosis not present

## 2019-06-01 DIAGNOSIS — Z833 Family history of diabetes mellitus: Secondary | ICD-10-CM | POA: Diagnosis not present

## 2019-06-01 DIAGNOSIS — T753XXD Motion sickness, subsequent encounter: Secondary | ICD-10-CM | POA: Diagnosis not present

## 2019-06-01 DIAGNOSIS — Z1331 Encounter for screening for depression: Secondary | ICD-10-CM | POA: Diagnosis not present

## 2019-06-01 DIAGNOSIS — E785 Hyperlipidemia, unspecified: Secondary | ICD-10-CM | POA: Diagnosis not present

## 2019-06-01 DIAGNOSIS — M545 Low back pain: Secondary | ICD-10-CM | POA: Diagnosis not present

## 2019-06-23 ENCOUNTER — Ambulatory Visit: Payer: Self-pay | Attending: Internal Medicine

## 2019-06-23 DIAGNOSIS — Z23 Encounter for immunization: Secondary | ICD-10-CM

## 2019-06-23 NOTE — Progress Notes (Signed)
   Covid-[facility] Vaccination [facility]  Name:  Amy Robbins    MRN: [id number] DOB: 1985-07-13  06/23/2019  [name] was observed post Covid-19 immunization for 15 minutes without incident. She was provided with Vaccine Information Sheet and instruction to access the V-Safe system.   [name] was instructed to call 911 with any severe reactions post vaccine: Marland Kitchen Difficulty breathing  . Swelling of face and throat  . A fast heartbeat  . A bad rash all over body  . Dizziness and weakness   Immunizations Administered    Name Date Dose VIS Date Route   Pfizer COVID-19 Vaccine 06/23/2019  8:45 AM 0.3 mL 02/20/2019 Intramuscular   Manufacturer: [employer], [employer]   Lot: [id number]   NDC: [address]-0

## 2020-06-09 DIAGNOSIS — Z23 Encounter for immunization: Secondary | ICD-10-CM | POA: Diagnosis not present

## 2020-06-27 DIAGNOSIS — E785 Hyperlipidemia, unspecified: Secondary | ICD-10-CM | POA: Diagnosis not present

## 2020-07-04 DIAGNOSIS — E785 Hyperlipidemia, unspecified: Secondary | ICD-10-CM | POA: Diagnosis not present

## 2020-07-04 DIAGNOSIS — Z0000 Encounter for general adult medical examination without abnormal findings: Secondary | ICD-10-CM | POA: Diagnosis not present

## 2020-07-20 DIAGNOSIS — U071 COVID-19: Secondary | ICD-10-CM | POA: Diagnosis not present

## 2020-10-27 DIAGNOSIS — Z6830 Body mass index (BMI) 30.0-30.9, adult: Secondary | ICD-10-CM | POA: Diagnosis not present

## 2020-10-27 DIAGNOSIS — Z01419 Encounter for gynecological examination (general) (routine) without abnormal findings: Secondary | ICD-10-CM | POA: Diagnosis not present

## 2020-11-08 ENCOUNTER — Other Ambulatory Visit: Payer: Self-pay | Admitting: Obstetrics & Gynecology

## 2020-11-08 DIAGNOSIS — Z803 Family history of malignant neoplasm of breast: Secondary | ICD-10-CM

## 2020-11-29 ENCOUNTER — Other Ambulatory Visit: Payer: Self-pay

## 2020-11-29 ENCOUNTER — Ambulatory Visit
Admission: RE | Admit: 2020-11-29 | Discharge: 2020-11-29 | Disposition: A | Discharge status: Home / Self Care | Payer: BC Managed Care – PPO | Source: Ambulatory Visit | Attending: Obstetrics & Gynecology | Admitting: Obstetrics & Gynecology

## 2020-11-29 DIAGNOSIS — Z803 Family history of malignant neoplasm of breast: Secondary | ICD-10-CM

## 2020-11-29 DIAGNOSIS — Z1231 Encounter for screening mammogram for malignant neoplasm of breast: Secondary | ICD-10-CM | POA: Diagnosis not present

## 2020-12-07 ENCOUNTER — Other Ambulatory Visit: Payer: Self-pay | Admitting: Obstetrics & Gynecology

## 2020-12-07 DIAGNOSIS — R928 Other abnormal and inconclusive findings on diagnostic imaging of breast: Secondary | ICD-10-CM

## 2020-12-26 ENCOUNTER — Ambulatory Visit
Admission: RE | Admit: 2020-12-26 | Discharge: 2020-12-26 | Disposition: A | Discharge status: Home / Self Care | Payer: BC Managed Care – PPO | Source: Ambulatory Visit | Attending: Obstetrics & Gynecology | Admitting: Obstetrics & Gynecology

## 2020-12-26 ENCOUNTER — Other Ambulatory Visit: Payer: Self-pay

## 2020-12-26 ENCOUNTER — Other Ambulatory Visit: Payer: Self-pay | Admitting: Obstetrics & Gynecology

## 2020-12-26 DIAGNOSIS — N6320 Unspecified lump in the left breast, unspecified quadrant: Secondary | ICD-10-CM

## 2020-12-26 DIAGNOSIS — R928 Other abnormal and inconclusive findings on diagnostic imaging of breast: Secondary | ICD-10-CM

## 2020-12-26 DIAGNOSIS — R922 Inconclusive mammogram: Secondary | ICD-10-CM | POA: Diagnosis not present

## 2020-12-28 ENCOUNTER — Ambulatory Visit
Admission: RE | Admit: 2020-12-28 | Discharge: 2020-12-28 | Disposition: A | Discharge status: Home / Self Care | Payer: BC Managed Care – PPO | Source: Ambulatory Visit | Attending: Obstetrics & Gynecology | Admitting: Obstetrics & Gynecology

## 2020-12-28 ENCOUNTER — Other Ambulatory Visit: Payer: Self-pay

## 2020-12-28 DIAGNOSIS — N6320 Unspecified lump in the left breast, unspecified quadrant: Secondary | ICD-10-CM

## 2020-12-28 DIAGNOSIS — D242 Benign neoplasm of left breast: Secondary | ICD-10-CM | POA: Diagnosis not present

## 2020-12-28 DIAGNOSIS — N6321 Unspecified lump in the left breast, upper outer quadrant: Secondary | ICD-10-CM | POA: Diagnosis not present

## 2021-05-18 DIAGNOSIS — Z803 Family history of malignant neoplasm of breast: Secondary | ICD-10-CM | POA: Diagnosis not present

## 2021-05-18 DIAGNOSIS — M25511 Pain in right shoulder: Secondary | ICD-10-CM | POA: Diagnosis not present

## 2021-05-18 DIAGNOSIS — Z6831 Body mass index (BMI) 31.0-31.9, adult: Secondary | ICD-10-CM | POA: Diagnosis not present

## 2021-07-10 DIAGNOSIS — Z8041 Family history of malignant neoplasm of ovary: Secondary | ICD-10-CM | POA: Diagnosis not present

## 2021-07-10 DIAGNOSIS — Z809 Family history of malignant neoplasm, unspecified: Secondary | ICD-10-CM | POA: Diagnosis not present

## 2021-07-10 DIAGNOSIS — Z309 Encounter for contraceptive management, unspecified: Secondary | ICD-10-CM | POA: Diagnosis not present

## 2021-07-10 DIAGNOSIS — Z8049 Family history of malignant neoplasm of other genital organs: Secondary | ICD-10-CM | POA: Diagnosis not present

## 2021-11-28 DIAGNOSIS — Z01419 Encounter for gynecological examination (general) (routine) without abnormal findings: Secondary | ICD-10-CM | POA: Diagnosis not present

## 2021-12-05 DIAGNOSIS — Z01419 Encounter for gynecological examination (general) (routine) without abnormal findings: Secondary | ICD-10-CM | POA: Diagnosis not present

## 2023-05-24 IMAGING — US US BREAST BX W LOC DEV 1ST LESION IMG BX SPEC US GUIDE*L*
1 series · 12 of 13 positions shown · non-contrast
Comparison: Previous exam(s).
COMPARISON: Previous exam(s).

Addendum:
CLINICAL DATA: 34-year-old female for tissue sampling of 2.5 cm
UPPER-OUTER LEFT breast mass.

EXAM:
ULTRASOUND GUIDED LEFT BREAST CORE NEEDLE BIOPSY

[Series 1: us breast bx w loc dev 1st lesion img bx spec us g · 0.07mm/px · 12 of 13 slices shown]
[im 1/13]
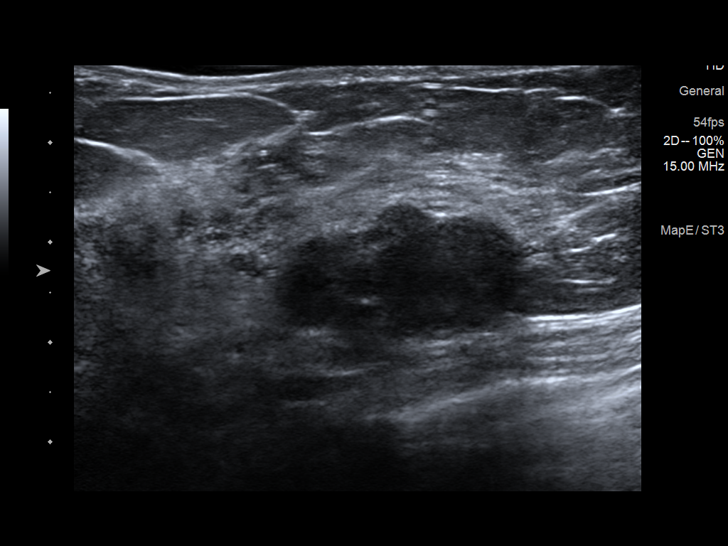
[im 2/13]
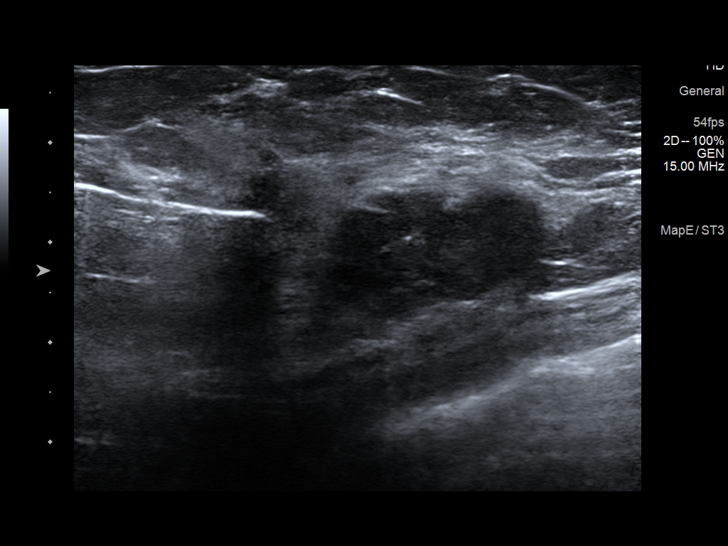
[im 3/13]
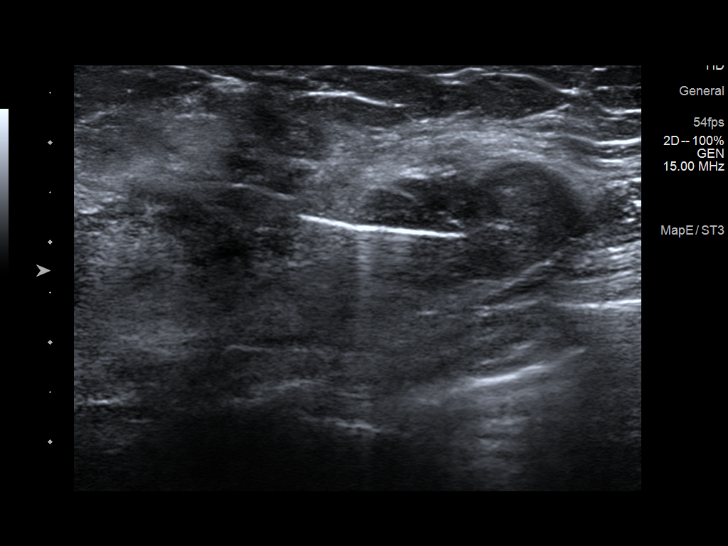
[im 4/13]
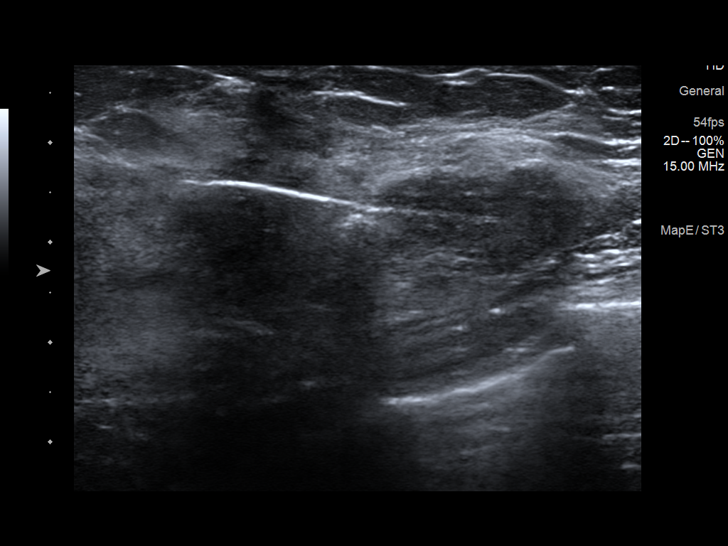
[im 5/13]
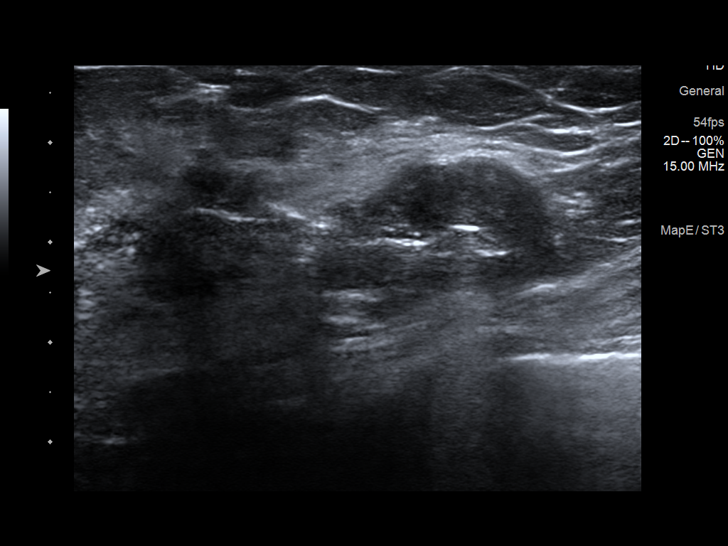
[im 6/13]
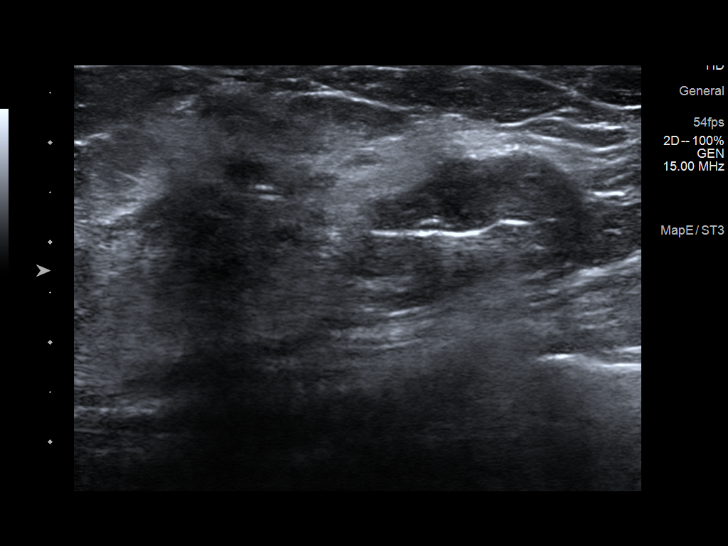
[im 8/13]
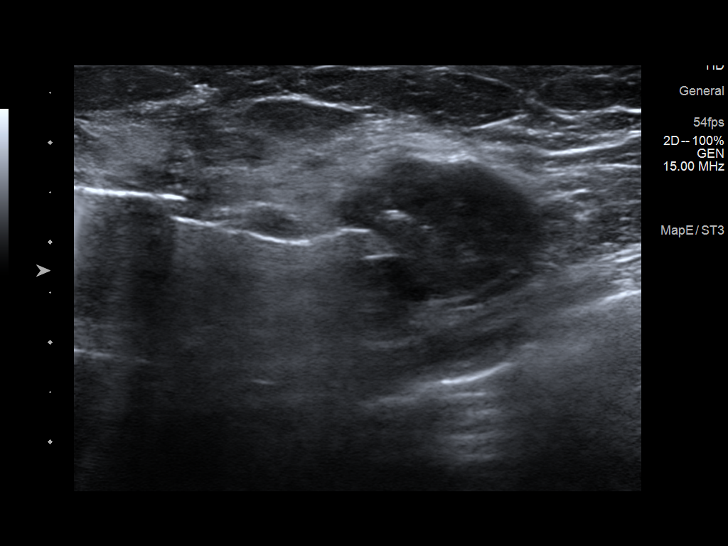
[im 9/13]
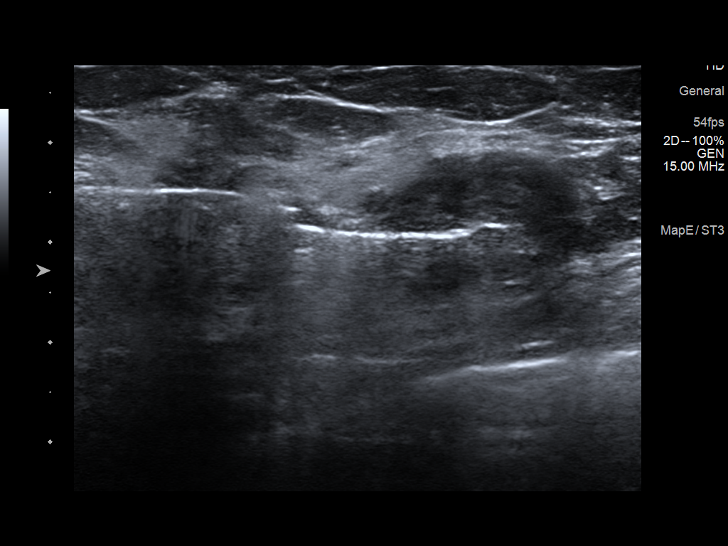
[im 10/13]
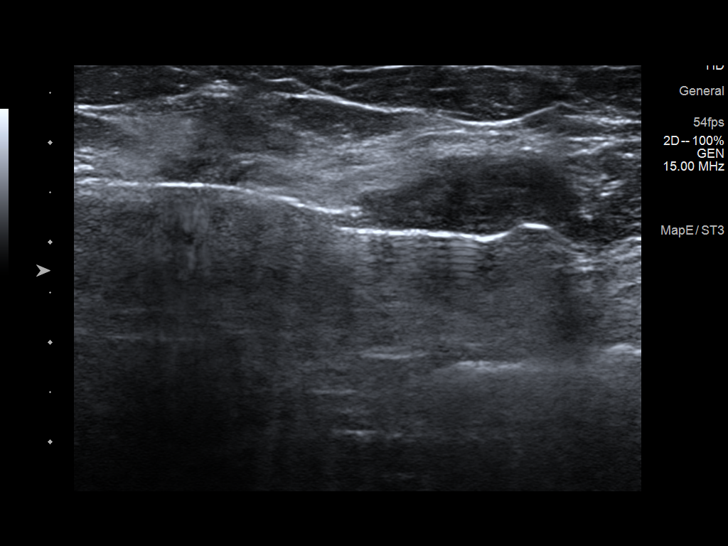
[im 11/13]
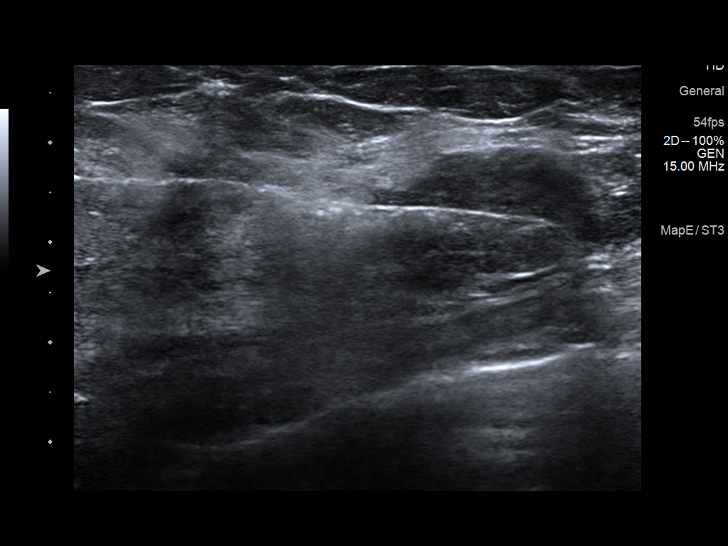
[im 12/13]
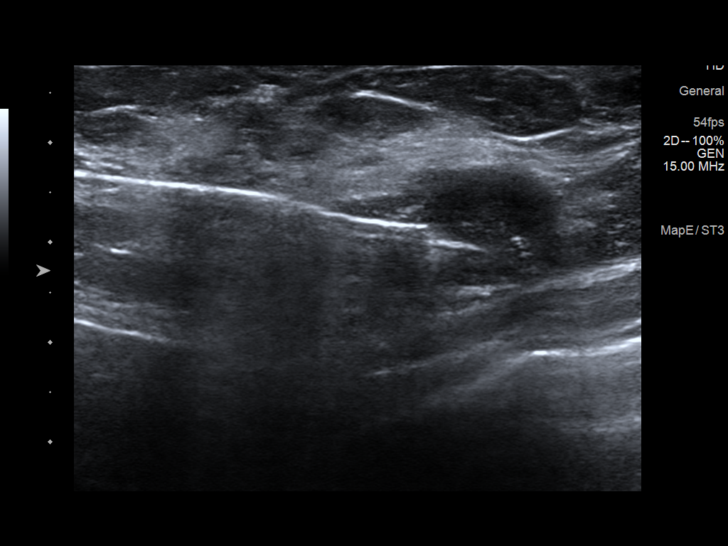
[im 13/13]
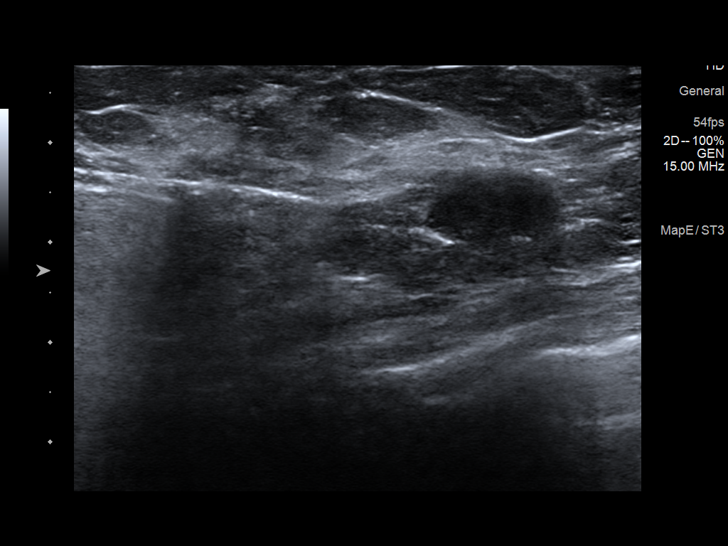

[12 of 13 positions shown; findings below may reference images not displayed]



Lesion quadrant: UPPER-OUTER LEFT breast

Using sterile technique and 1% Lidocaine as local anesthetic, under
direct ultrasound visualization, a 12 gauge Danero device was
used to perform biopsy of the 2.5 cm mass at the 1 o'clock position
of the LEFT breast using a MEDIAL approach. At the conclusion of the
procedure a RIBBON tissue marker clip was deployed into the biopsy
cavity. Follow up 2 view mammogram was performed and dictated
separately.
IMPRESSION: Ultrasound guided biopsy of 2.5 cm UPPER-OUTER LEFT breast mass. No
apparent complications.

ADDENDUM:
Pathology revealed FIBROADENOMA of the LEFT breast, upper outer,
1:00 o'clock, (ribbon clip). This was found to be concordant by Dr.
Jinquan Azzuan.

Pathology results were discussed with the patient by telephone. The
patient reported doing well after the biopsy with no tenderness at
the site. Post biopsy instructions and care were reviewed and
questions were answered. The patient was encouraged to call The

The patient was instructed to continue with monthly self breast
examinations, clinical follow-up as needed, and to return for annual
mammography at 39, unless instructed otherwise.

Pathology results reported by Dreon Elvir, RN on 12/29/2020.



Lesion quadrant: UPPER-OUTER LEFT breast

Using sterile technique and 1% Lidocaine as local anesthetic, under
direct ultrasound visualization, a 12 gauge Danero device was
used to perform biopsy of the 2.5 cm mass at the 1 o'clock position
of the LEFT breast using a MEDIAL approach. At the conclusion of the
procedure a RIBBON tissue marker clip was deployed into the biopsy
cavity. Follow up 2 view mammogram was performed and dictated
separately.
IMPRESSION: Ultrasound guided biopsy of 2.5 cm UPPER-OUTER LEFT breast mass. No
apparent complications.

## 2023-05-24 IMAGING — MG MM BREAST LOCALIZATION CLIP
4 series · 4 of 12 positions shown · non-contrast
Comparison: Previous exam(s).

CLINICAL DATA: Evaluate RIBBON biopsy clip placement following
ultrasound-guided LEFT breast biopsy.

EXAM:
3D DIAGNOSTIC LEFT MAMMOGRAM POST ULTRASOUND BIOPSY

[L CC synth-2D]
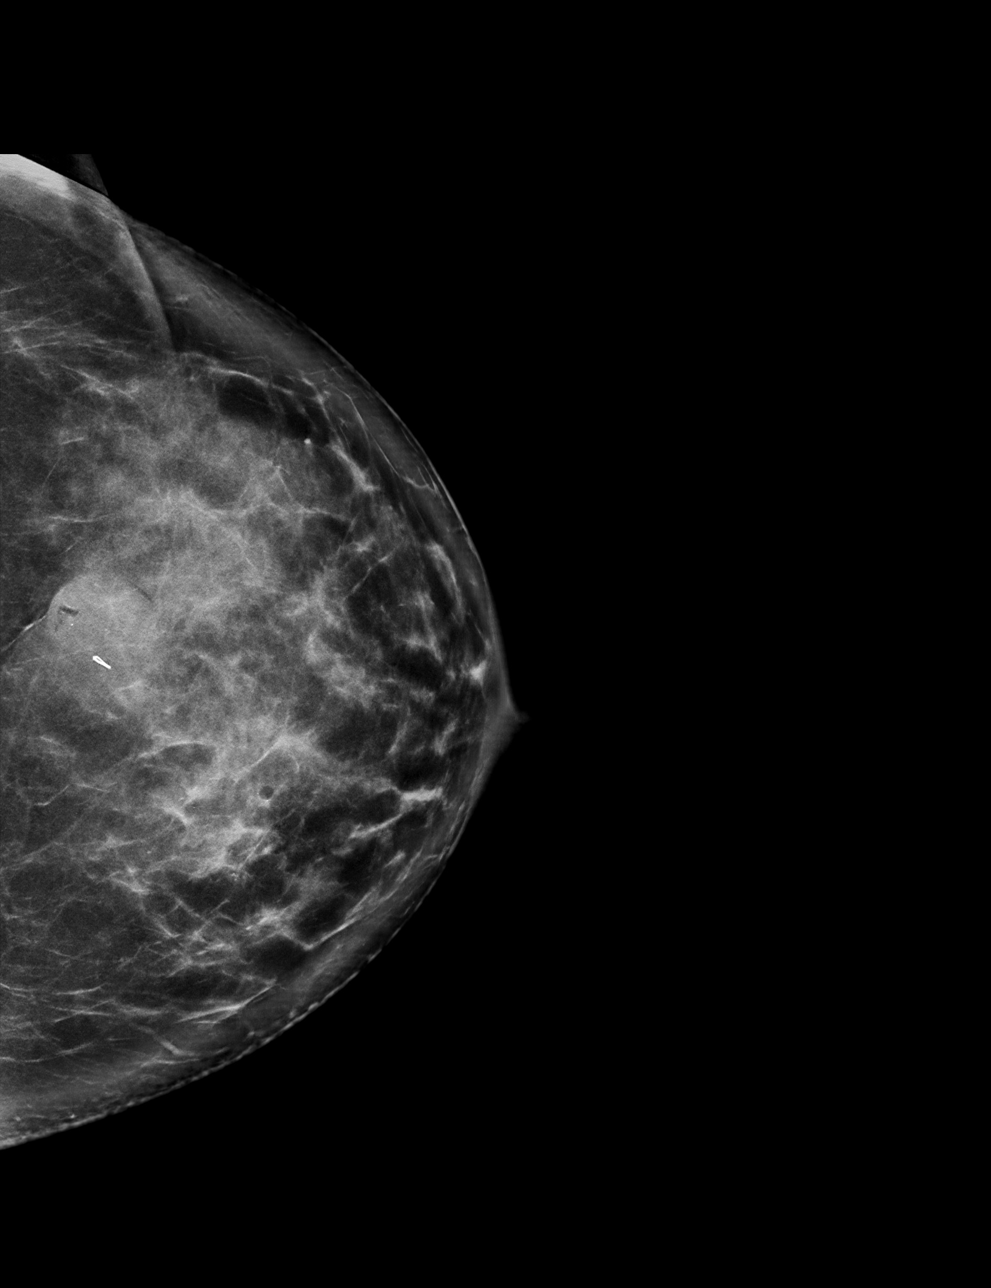

[L ML synth-2D]
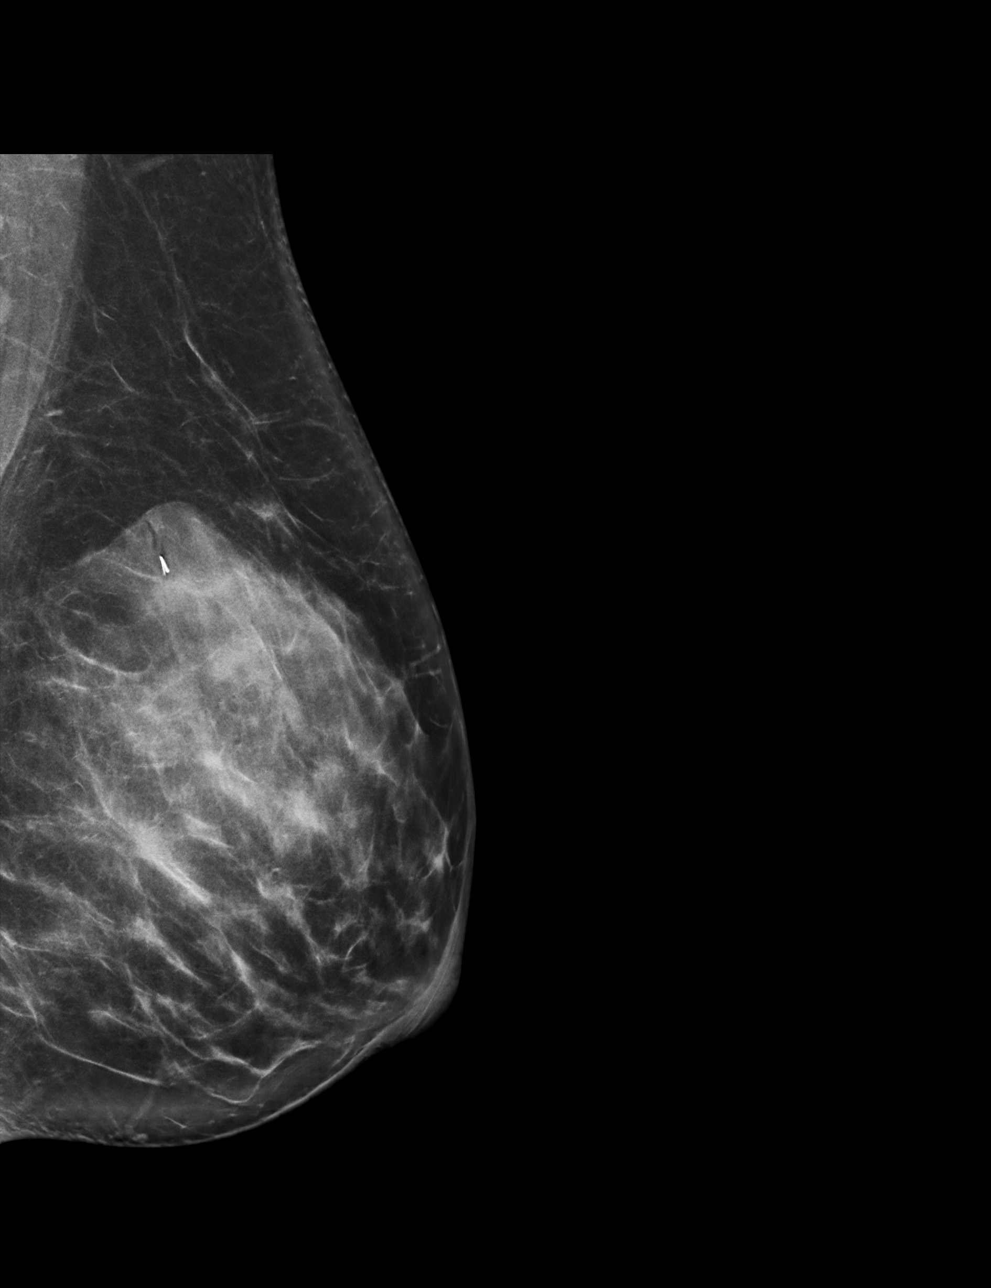

[L CC tomo · tomo slice 39/78.0]
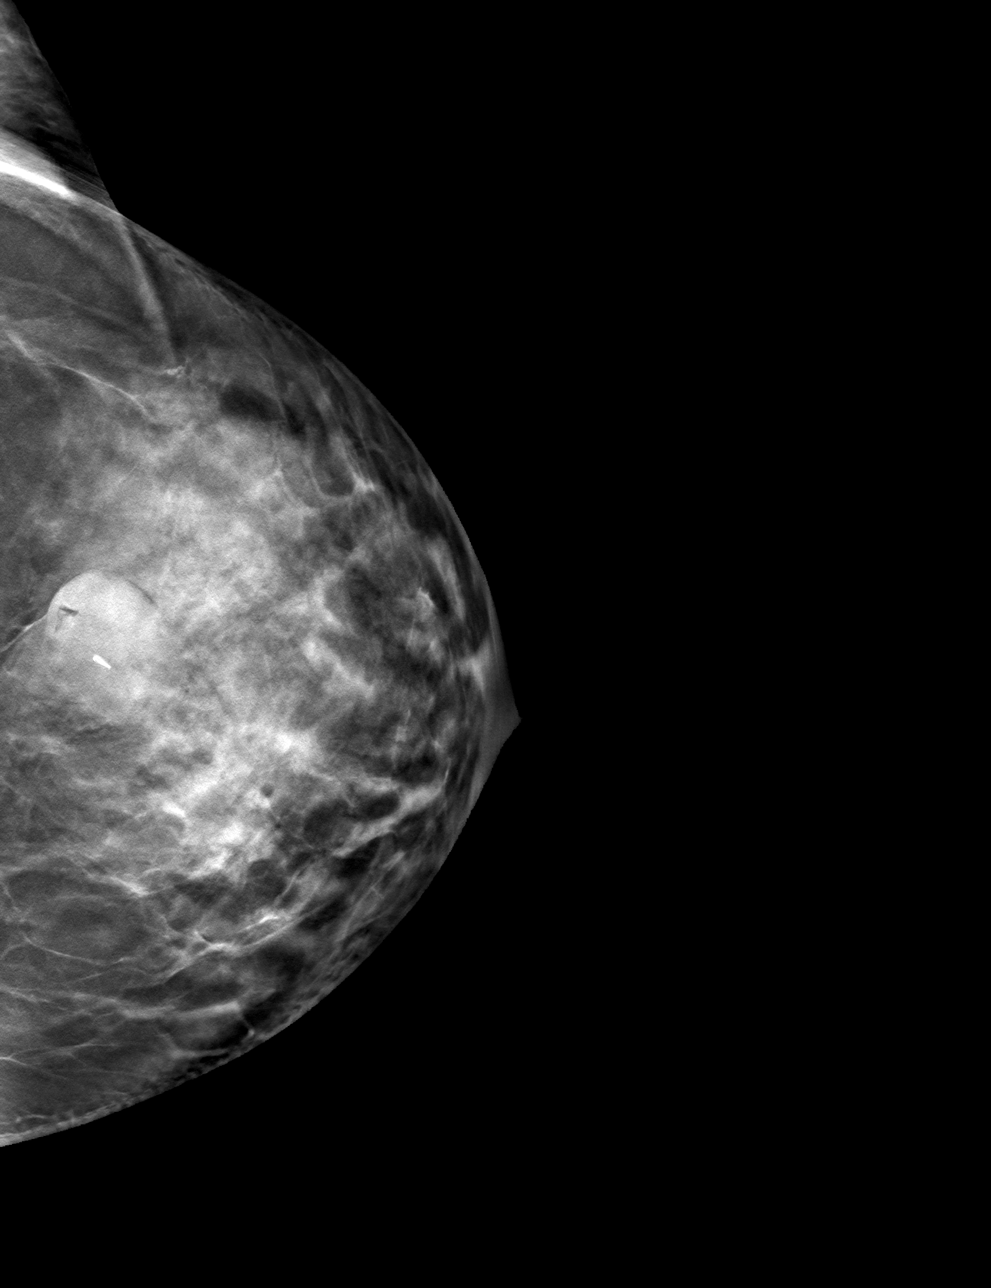

[L ML tomo · tomo slice 41/80.0]
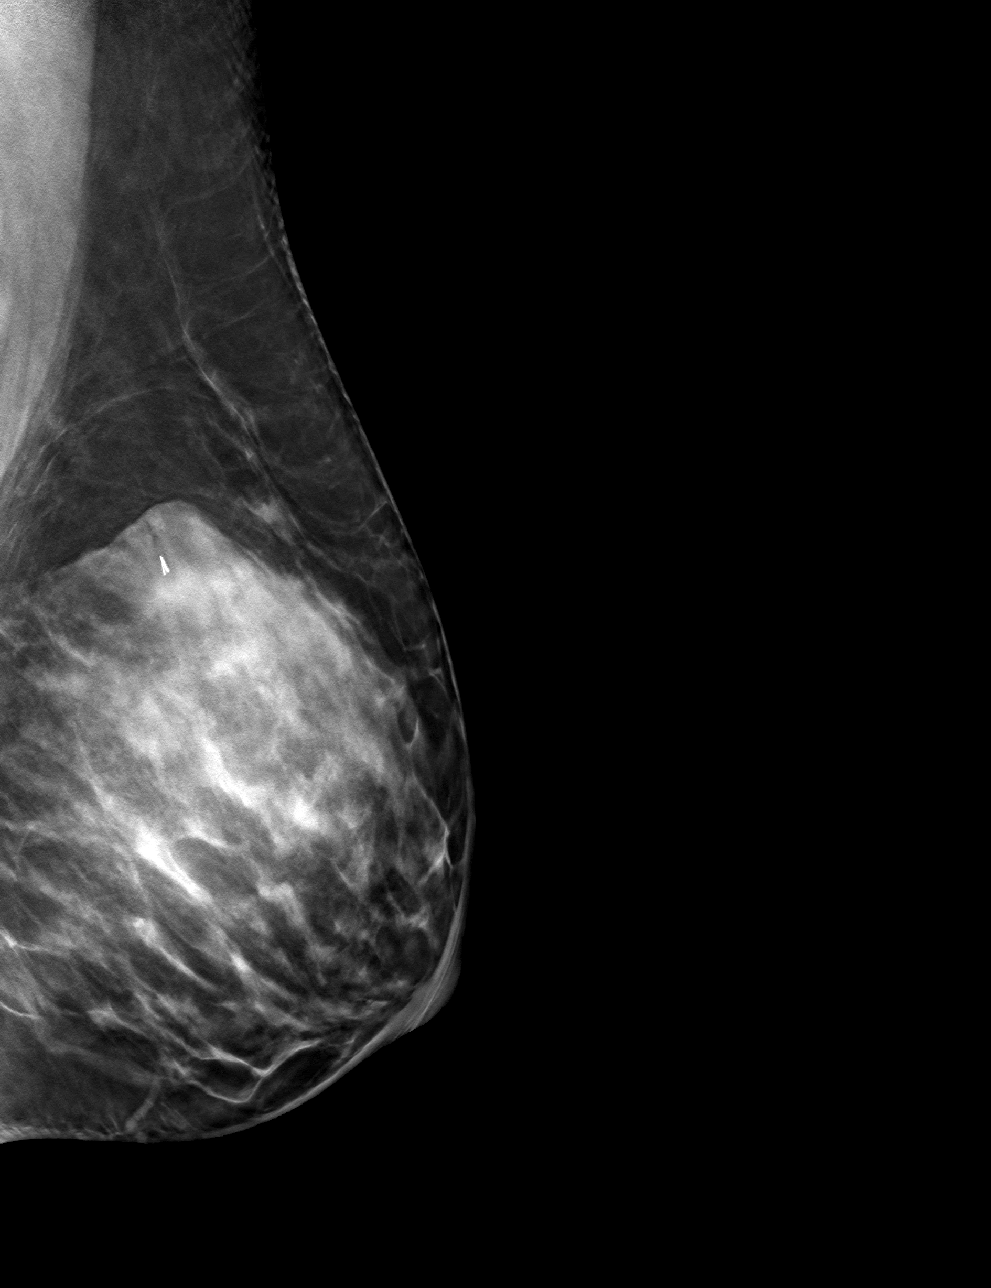

[4 of 12 positions shown; findings below may reference images not displayed]

FINDINGS: 3D Mammographic images were obtained following ultrasound guided
biopsy of the 2.5 cm mass at the 1 o'clock position of the LEFT
breast. The RIBBON biopsy marking clip is in expected position at
the site of biopsy.
IMPRESSION: Appropriate positioning of the RIBBON shaped biopsy marking clip at
the site of biopsy in the UPPER OUTER LEFT breast.

Final Assessment: Post Procedure Mammograms for Marker Placement
